# Patient Record
Sex: Male | Born: 1960 | Race: White | Hispanic: No | Smoking: Current every day smoker
Health system: Southern US, Community
[De-identification: ages and names within clinical notes are randomized; demographics above are authoritative.]

## PROBLEM LIST (undated history)

## (undated) DIAGNOSIS — I2510 Atherosclerotic heart disease of native coronary artery without angina pectoris: Secondary | ICD-10-CM

## (undated) DIAGNOSIS — I10 Essential (primary) hypertension: Secondary | ICD-10-CM

---

## 2017-07-22 ENCOUNTER — Emergency Department (HOSPITAL_COMMUNITY): Payer: BLUE CROSS/BLUE SHIELD

## 2017-07-22 ENCOUNTER — Emergency Department (HOSPITAL_COMMUNITY)
Admission: EM | Admit: 2017-07-22 | Discharge: 2017-07-23 | Disposition: A | Discharge status: Home / Self Care | Payer: BLUE CROSS/BLUE SHIELD | Attending: Emergency Medicine | Admitting: Emergency Medicine

## 2017-07-22 ENCOUNTER — Encounter (HOSPITAL_COMMUNITY): Payer: Self-pay | Admitting: Emergency Medicine

## 2017-07-22 DIAGNOSIS — S92302A Fracture of unspecified metatarsal bone(s), left foot, initial encounter for closed fracture: Secondary | ICD-10-CM | POA: Diagnosis not present

## 2017-07-22 DIAGNOSIS — S32009A Unspecified fracture of unspecified lumbar vertebra, initial encounter for closed fracture: Secondary | ICD-10-CM | POA: Diagnosis not present

## 2017-07-22 DIAGNOSIS — T1490XA Injury, unspecified, initial encounter: Secondary | ICD-10-CM

## 2017-07-22 DIAGNOSIS — S2249XA Multiple fractures of ribs, unspecified side, initial encounter for closed fracture: Secondary | ICD-10-CM | POA: Insufficient documentation

## 2017-07-22 DIAGNOSIS — I2510 Atherosclerotic heart disease of native coronary artery without angina pectoris: Secondary | ICD-10-CM | POA: Diagnosis not present

## 2017-07-22 DIAGNOSIS — Z23 Encounter for immunization: Secondary | ICD-10-CM | POA: Insufficient documentation

## 2017-07-22 DIAGNOSIS — S2220XA Unspecified fracture of sternum, initial encounter for closed fracture: Secondary | ICD-10-CM

## 2017-07-22 DIAGNOSIS — R4020 Unspecified coma: Secondary | ICD-10-CM | POA: Diagnosis present

## 2017-07-22 DIAGNOSIS — Y999 Unspecified external cause status: Secondary | ICD-10-CM | POA: Insufficient documentation

## 2017-07-22 DIAGNOSIS — Y939 Activity, unspecified: Secondary | ICD-10-CM | POA: Insufficient documentation

## 2017-07-22 DIAGNOSIS — F17210 Nicotine dependence, cigarettes, uncomplicated: Secondary | ICD-10-CM | POA: Diagnosis not present

## 2017-07-22 DIAGNOSIS — I10 Essential (primary) hypertension: Secondary | ICD-10-CM | POA: Insufficient documentation

## 2017-07-22 DIAGNOSIS — Y929 Unspecified place or not applicable: Secondary | ICD-10-CM | POA: Insufficient documentation

## 2017-07-22 HISTORY — DX: Essential (primary) hypertension: I10

## 2017-07-22 HISTORY — DX: Atherosclerotic heart disease of native coronary artery without angina pectoris: I25.10

## 2017-07-22 LAB — I-STAT CHEM 8, ED
BUN: 14 mg/dL (ref 6–20)
CALCIUM ION: 0.97 mmol/L — AB (ref 1.15–1.40)
Chloride: 98 mmol/L — ABNORMAL LOW (ref 101–111)
Creatinine, Ser: 1.4 mg/dL — ABNORMAL HIGH (ref 0.61–1.24)
Glucose, Bld: 101 mg/dL — ABNORMAL HIGH (ref 65–99)
HCT: 37 % — ABNORMAL LOW (ref 39.0–52.0)
Hemoglobin: 12.6 g/dL — ABNORMAL LOW (ref 13.0–17.0)
Potassium: 3.2 mmol/L — ABNORMAL LOW (ref 3.5–5.1)
SODIUM: 134 mmol/L — AB (ref 135–145)
TCO2: 19 mmol/L — AB (ref 22–32)

## 2017-07-22 LAB — SAMPLE TO BLOOD BANK

## 2017-07-22 LAB — I-STAT CG4 LACTIC ACID, ED: Lactic Acid, Venous: 4.06 mmol/L (ref 0.5–1.9)

## 2017-07-22 LAB — COMPREHENSIVE METABOLIC PANEL
ALBUMIN: 3.7 g/dL (ref 3.5–5.0)
ALT: 52 U/L (ref 17–63)
ANION GAP: 17 — AB (ref 5–15)
AST: 56 U/L — ABNORMAL HIGH (ref 15–41)
Alkaline Phosphatase: 33 U/L — ABNORMAL LOW (ref 38–126)
BILIRUBIN TOTAL: 1 mg/dL (ref 0.3–1.2)
BUN: 15 mg/dL (ref 6–20)
CALCIUM: 8.5 mg/dL — AB (ref 8.9–10.3)
CHLORIDE: 99 mmol/L — AB (ref 101–111)
CO2: 18 mmol/L — ABNORMAL LOW (ref 22–32)
CREATININE: 1.36 mg/dL — AB (ref 0.61–1.24)
GFR calc Af Amer: >60 mL/min (ref >60)
GFR calc non Af Amer: 55 mL/min — ABNORMAL LOW (ref >60)
Glucose, Bld: 101 mg/dL — ABNORMAL HIGH (ref 65–99)
Potassium: 3.2 mmol/L — ABNORMAL LOW (ref 3.5–5.1)
SODIUM: 134 mmol/L — AB (ref 135–145)
TOTAL PROTEIN: 6 g/dL — AB (ref 6.5–8.1)

## 2017-07-22 LAB — CBC
HCT: 38.3 % — ABNORMAL LOW (ref 39.0–52.0)
Hemoglobin: 13.4 g/dL (ref 13.0–17.0)
MCH: 32.7 pg (ref 26.0–34.0)
MCHC: 35 g/dL (ref 30.0–36.0)
MCV: 93.4 fL (ref 78.0–100.0)
PLATELETS: 225 10*3/uL (ref 150–400)
RBC: 4.1 MIL/uL — AB (ref 4.22–5.81)
RDW: 12.6 % (ref 11.5–15.5)
WBC: 8.8 10*3/uL (ref 4.0–10.5)

## 2017-07-22 LAB — PROTIME-INR
INR: 1.02
Prothrombin Time: 13.3 seconds (ref 11.4–15.2)

## 2017-07-22 LAB — ETHANOL: ALCOHOL ETHYL (B): 264 mg/dL — AB (ref <10)

## 2017-07-22 LAB — CDS SEROLOGY

## 2017-07-22 MED ORDER — IOPAMIDOL (ISOVUE-370) INJECTION 76%
100.0000 mL | Freq: Once | INTRAVENOUS | Status: AC | PRN
  Administered 2017-07-22: 100 mL via INTRAVENOUS

## 2017-07-22 MED ORDER — IOPAMIDOL (ISOVUE-370) INJECTION 76%
INTRAVENOUS | Status: AC
Start: 2017-07-22 — End: 2017-07-22
  Administered 2017-07-22: 100 mL via INTRAVENOUS
  Filled 2017-07-22: qty 100

## 2017-07-22 MED ORDER — TETANUS-DIPHTH-ACELL PERTUSSIS 5-2.5-18.5 LF-MCG/0.5 IM SUSP
0.5000 mL | Freq: Once | INTRAMUSCULAR | Status: AC
  Administered 2017-07-22: 0.5 mL via INTRAMUSCULAR
  Filled 2017-07-22: qty 0.5

## 2017-07-22 MED ORDER — SODIUM CHLORIDE 0.9 % IV BOLUS
1000.0000 mL | Freq: Once | INTRAVENOUS | Status: AC
  Administered 2017-07-22: 1000 mL via INTRAVENOUS

## 2017-07-22 NOTE — ED Notes (Signed)
Pt took off C-Collar. This nurse educated the patient that he needs to keep the collar on until the Dr. clears him. Pt still does not want C-collar on

## 2017-07-22 NOTE — ED Notes (Signed)
Pt sat himself up in bed, pt informed he should wait for Dr to clear him. Pt. Acknowledged and still continued to sit up

## 2017-07-22 NOTE — ED Notes (Signed)
Patient taken to CT.
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
## 2017-07-22 NOTE — ED Notes (Signed)
Patient transported to CT 
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
## 2017-07-23 ENCOUNTER — Encounter (HOSPITAL_COMMUNITY): Payer: Self-pay | Admitting: Emergency Medicine

## 2017-07-23 ENCOUNTER — Emergency Department (HOSPITAL_COMMUNITY): Payer: BLUE CROSS/BLUE SHIELD

## 2017-07-23 LAB — I-STAT CG4 LACTIC ACID, ED
LACTIC ACID, VENOUS: 3.7 mmol/L — AB (ref 0.5–1.9)
Lactic Acid, Venous: 3.36 mmol/L (ref 0.5–1.9)

## 2017-07-23 LAB — I-STAT TROPONIN, ED: TROPONIN I, POC: 0.02 ng/mL (ref 0.00–0.08)

## 2017-07-23 MED ORDER — LACTATED RINGERS IV BOLUS
1000.0000 mL | Freq: Once | INTRAVENOUS | Status: AC
  Administered 2017-07-23: 1000 mL via INTRAVENOUS

## 2017-07-23 MED ORDER — MORPHINE SULFATE (PF) 4 MG/ML IV SOLN
4.0000 mg | Freq: Once | INTRAVENOUS | Status: AC
  Administered 2017-07-23: 4 mg via INTRAVENOUS
  Filled 2017-07-23: qty 1

## 2017-07-23 MED ORDER — HYDROCODONE-ACETAMINOPHEN 5-325 MG PO TABS: 1.0000 | ORAL_TABLET | Freq: Four times a day (QID) | ORAL | 0 refills | Status: AC | PRN

## 2017-07-23 MED ORDER — HYDROCODONE-ACETAMINOPHEN 5-325 MG PO TABS
1.0000 | ORAL_TABLET | Freq: Once | ORAL | Status: AC
  Administered 2017-07-23: 1 via ORAL
  Filled 2017-07-23: qty 1

## 2017-07-23 MED ORDER — ONDANSETRON HCL 4 MG/2ML IJ SOLN
4.0000 mg | Freq: Once | INTRAMUSCULAR | Status: AC
  Administered 2017-07-23: 4 mg via INTRAVENOUS
  Filled 2017-07-23: qty 2
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
## 2017-07-23 NOTE — Progress Notes (Signed)
Orthopedic Tech Progress Note Patient Details:  Tristan Proto 07-25-1960 [id number]  Ortho Devices Type of Ortho Device: Crutches, Post (short leg) splint Ortho Device/Splint Location: lle Ortho Device/Splint Interventions: Ordered, Application, Adjustment   Post Interventions Patient Tolerated: Well Instructions Provided: Care of device, Adjustment of device   Trinna Post 07/23/2017, 6:15 AM
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
